# Patient Record
Sex: Female | Born: 2000 | Race: White | Hispanic: No | Marital: Single | Smoking: Never smoker
Health system: Southern US, Community
[De-identification: ages and names within clinical notes are randomized; demographics above are authoritative.]

## PROBLEM LIST (undated history)

## (undated) HISTORY — PX: WISDOM TOOTH EXTRACTION: SHX21

---

## 2019-12-15 ENCOUNTER — Encounter: Payer: Self-pay | Admitting: Gastroenterology

## 2019-12-23 ENCOUNTER — Other Ambulatory Visit: Payer: Self-pay

## 2019-12-23 ENCOUNTER — Encounter: Payer: Self-pay | Admitting: Emergency Medicine

## 2019-12-23 DIAGNOSIS — K5900 Constipation, unspecified: Secondary | ICD-10-CM | POA: Diagnosis not present

## 2019-12-23 DIAGNOSIS — R140 Abdominal distension (gaseous): Secondary | ICD-10-CM | POA: Insufficient documentation

## 2019-12-23 LAB — CBC WITH DIFFERENTIAL/PLATELET
Abs Immature Granulocytes: 0.03 10*3/uL (ref 0.00–0.07)
Basophils Absolute: 0.1 10*3/uL (ref 0.0–0.1)
Basophils Relative: 1 %
Eosinophils Absolute: 0.2 10*3/uL (ref 0.0–0.5)
Eosinophils Relative: 2 %
HCT: 40.3 % (ref 36.0–46.0)
Hemoglobin: 13.2 g/dL (ref 12.0–15.0)
Immature Granulocytes: 0 %
Lymphocytes Relative: 42 %
Lymphs Abs: 3.9 10*3/uL (ref 0.7–4.0)
MCH: 29.4 pg (ref 26.0–34.0)
MCHC: 32.8 g/dL (ref 30.0–36.0)
MCV: 89.8 fL (ref 80.0–100.0)
Monocytes Absolute: 0.7 10*3/uL (ref 0.1–1.0)
Monocytes Relative: 8 %
Neutro Abs: 4.5 10*3/uL (ref 1.7–7.7)
Neutrophils Relative %: 47 %
Platelets: 269 10*3/uL (ref 150–400)
RBC: 4.49 MIL/uL (ref 3.87–5.11)
RDW: 12.8 % (ref 11.5–15.5)
WBC: 9.3 10*3/uL (ref 4.0–10.5)
nRBC: 0 % (ref 0.0–0.2)

## 2019-12-23 LAB — URINALYSIS, COMPLETE (UACMP) WITH MICROSCOPIC
Bilirubin Urine: NEGATIVE
Glucose, UA: NEGATIVE mg/dL
Hgb urine dipstick: NEGATIVE
Ketones, ur: NEGATIVE mg/dL
Leukocytes,Ua: NEGATIVE
Nitrite: NEGATIVE
Protein, ur: NEGATIVE mg/dL
Specific Gravity, Urine: 1.026 (ref 1.005–1.030)
pH: 5 (ref 5.0–8.0)

## 2019-12-23 LAB — COMPREHENSIVE METABOLIC PANEL
ALT: 20 U/L (ref 0–44)
AST: 26 U/L (ref 15–41)
Albumin: 4.8 g/dL (ref 3.5–5.0)
Alkaline Phosphatase: 55 U/L (ref 38–126)
Anion gap: 7 (ref 5–15)
BUN: 18 mg/dL (ref 6–20)
CO2: 27 mmol/L (ref 22–32)
Calcium: 9.2 mg/dL (ref 8.9–10.3)
Chloride: 105 mmol/L (ref 98–111)
Creatinine, Ser: 0.67 mg/dL (ref 0.44–1.00)
GFR calc Af Amer: >60 mL/min (ref >60)
GFR calc non Af Amer: >60 mL/min (ref >60)
Glucose, Bld: 108 mg/dL — ABNORMAL HIGH (ref 70–99)
Potassium: 3.7 mmol/L (ref 3.5–5.1)
Sodium: 139 mmol/L (ref 135–145)
Total Bilirubin: 0.6 mg/dL (ref 0.3–1.2)
Total Protein: 7 g/dL (ref 6.5–8.1)

## 2019-12-23 LAB — POCT PREGNANCY, URINE: Preg Test, Ur: NEGATIVE

## 2019-12-23 LAB — LIPASE, BLOOD: Lipase: 29 U/L (ref 11–51)

## 2019-12-23 NOTE — ED Triage Notes (Addendum)
Patient ambulatory to triage with steady gait, without difficulty or distress noted, mask in place; pt reports generalized abd pain and bloating x month with no accomp symptoms

## 2019-12-24 ENCOUNTER — Emergency Department
Admission: EM | Admit: 2019-12-24 | Discharge: 2019-12-24 | Disposition: A | Discharge status: Home / Self Care | Payer: 59 | Attending: Emergency Medicine | Admitting: Emergency Medicine

## 2019-12-24 ENCOUNTER — Emergency Department: Payer: 59

## 2019-12-24 DIAGNOSIS — R140 Abdominal distension (gaseous): Secondary | ICD-10-CM

## 2019-12-24 DIAGNOSIS — K5900 Constipation, unspecified: Secondary | ICD-10-CM

## 2019-12-24 NOTE — Discharge Instructions (Signed)
Constipation: Take colace twice a day everyday. Take senna once a day at bedtime. Take daily probiotics. Drink plenty of fluids and eat a diet rich in fiber. For the next 3 -5 days take 3600mg  or 87ml of 400mg /71ml Milk of Magnesia at bedtime. If that does not work, take 1 cap full of miralax in the morning and one in the evening for 3-5days.  Follow-up with your GI doctor on your scheduled appointment.  Follow-up with your PCP for further monitoring.  Return to the emergency room for new or worsening abdominal pain, vomiting, fever.

## 2019-12-24 NOTE — ED Notes (Addendum)
Pt taken to Xray.

## 2019-12-24 NOTE — ED Notes (Signed)
Pt back from X-ray.  

## 2019-12-24 NOTE — ED Provider Notes (Signed)
[facility] Emergency Department Provider Note  ____________________________________________  Time seen: Approximately 12:29 AM  I have reviewed the triage vital signs and the nursing notes.   HISTORY  Chief Complaint Abdominal Pain   HPI Natalye Kott is a 19 y.o. female no significant past medical history who presents for evaluation of abdominal pain and bloating.  Patient reports that she has been extremely bloated over the last month.  Has had intermittent episodes of sharp abdominal pain.  She reports 3 episodes in total with the third 1 being today which prompted her visit to the emergency room.  The pain has now resolved.  Patient reports that she usually goes to the bathroom 4-5 times a day for bowel movement but over the last month has only been going once or twice.  Has had more gas than normal. No prior abdominal surgeries. Passing flatus.  No nausea or vomiting, no diarrhea or constipation, no vaginal discharge, no dysuria or hematuria, no chest pain or shortness of breath.  Patient denies any noticeable weight changes.  She reports that she has been keeping a food diary and is unable to pinpoint any specific foods that make her feel worse or better.  She denies any family history of IBS, IBD, celiac, GI or GU malignancy.  She is scheduled an appointment with the GI doctor but that is not until the mid of March. Menstrual periods are normal. She has an IUD in place.   PMH None - reviewed  Allergies Patient has no known allergies.  FH No family history of GI or GU malignancy  Social History Smoking - no Alcohol - occasionally Drugs - none currently  Review of Systems  Constitutional: Negative for fever. Eyes: Negative for visual changes. ENT: Negative for sore throat. Neck: No neck pain  Cardiovascular: Negative for chest pain. Respiratory: Negative for shortness of breath. Gastrointestinal: + bloating and abdominal pain. No vomiting or  diarrhea. Genitourinary: Negative for dysuria. Musculoskeletal: Negative for back pain. Skin: Negative for rash. Neurological: Negative for headaches, weakness or numbness. Psych: No SI or HI  ____________________________________________   PHYSICAL EXAM:  VITAL SIGNS: ED Triage Vitals  Enc Vitals Group     BP 12/23/19 2314 100/69     Pulse Rate 12/23/19 2314 63     Resp 12/23/19 2314 18     Temp 12/23/19 2314 98 F (36.7 C)     Temp Source 12/23/19 2314 Oral     SpO2 12/23/19 2314 100 %     Weight 12/23/19 2308 119 lb (54 kg)     Height 12/23/19 2308 5\' 3"  (1.6 m)     Head Circumference --      Peak Flow --      Pain Score 12/23/19 2308 6     Pain Loc --      Pain Edu? --      Excl. in GC? --     Constitutional: Alert and oriented. Well appearing and in no apparent distress. HEENT:      Head: Normocephalic and atraumatic.         Eyes: Conjunctivae are normal. Sclera is non-icteric.       Mouth/Throat: Mucous membranes are moist.       Neck: Supple with no signs of meningismus. Cardiovascular: Regular rate and rhythm. No murmurs, gallops, or rubs.  Respiratory: Normal respiratory effort. Lungs are clear to auscultation bilaterally.  Gastrointestinal: Soft, non tender, and non distended with positive bowel sounds. No rebound or guarding.  Genitourinary: No CVA tenderness. Musculoskeletal: No edema, cyanosis, or erythema of extremities. Neurologic: Normal speech and language. Face is symmetric. Moving all extremities. No gross focal neurologic deficits are appreciated. Skin: Skin is warm, dry and intact. No rash noted. Psychiatric: Mood and affect are normal. Speech and behavior are normal.  ____________________________________________   LABS (all labs ordered are listed, but only abnormal results are displayed)  Labs Reviewed  COMPREHENSIVE METABOLIC PANEL - Abnormal; Notable for the following components:      Result Value   Glucose, Bld 108 (*)    All other  components within normal limits  URINALYSIS, COMPLETE (UACMP) WITH MICROSCOPIC - Abnormal; Notable for the following components:   Color, Urine YELLOW (*)    APPearance HAZY (*)    Bacteria, UA MANY (*)    All other components within normal limits  CBC WITH DIFFERENTIAL/PLATELET  LIPASE, BLOOD  POCT PREGNANCY, URINE   ____________________________________________  EKG  none  ____________________________________________  RADIOLOGY  I have personally reviewed the images performed during this visit and I agree with the Radiologist's read.   Interpretation by Radiologist:  DG Abdomen 1 View  Result Date: 12/24/2019 CLINICAL DATA:  Abdominal bloating, generalized abdominal pain for 1 month EXAM: ABDOMEN - 1 VIEW COMPARISON:  None. FINDINGS: Supine frontal view of the abdomen and pelvis excludes the hemidiaphragms by collimation. Moderate stool throughout the colon. No obstruction or ileus. No masses or abnormal calcifications. IUD overlies the pelvis. No acute bony abnormalities. IMPRESSION: 1. Moderate fecal retention. 2. Otherwise unremarkable exam. Electronically Signed   By: Sharlet Salina M.D.   On: 12/24/2019 00:36      ____________________________________________   PROCEDURES  Procedure(s) performed: None Procedures Critical Care performed:  None ____________________________________________   INITIAL IMPRESSION / ASSESSMENT AND PLAN / ED COURSE   19 y.o. female no significant past medical history who presents for evaluation of abdominal bloating x 1 month and intermittent abd pain.  Patient is extremely well-appearing in no distress with normal vital signs, abdomen is soft with no palpable masses, no tenderness, positive bowel sounds, no distention.  Differential diagnosis including constipation versus IBS versus pregnancy versus IBD versus SBO.  Labs WNL. KUB showing moderate stool burden. UA negative. Upreg negative. Will dc home on aggressive bowel regimen for the  next 3-5 days to see if symptoms resolve. Recommended keeping appointment with GI on March for further eval and discussed close follow up with PCP for further management. Discussed my standard return precautions.        As part of my medical decision making, I reviewed the following data within the electronic MEDICAL RECORD NUMBER Nursing notes reviewed and incorporated, Labs reviewed , Old chart reviewed, Radiograph reviewed , Notes from prior ED visits and [place] Controlled Substance Database   Please note:  Patient was evaluated in Emergency Department today for the symptoms described in the history of present illness. Patient was evaluated in the context of the global COVID-19 pandemic, which necessitated consideration that the patient might be at risk for infection with the SARS-CoV-2 virus that causes COVID-19. Institutional protocols and algorithms that pertain to the evaluation of patients at risk for COVID-19 are in a state of rapid change based on information released by regulatory bodies including the CDC and federal and state organizations. These policies and algorithms were followed during the patient's care in the ED.  Some ED evaluations and interventions may be delayed as a result of limited staffing during the pandemic.   ____________________________________________  FINAL CLINICAL IMPRESSION(S) / ED DIAGNOSES   Final diagnoses:  Abdominal bloating  Constipation, unspecified constipation type      NEW MEDICATIONS STARTED DURING THIS VISIT:  ED Discharge Orders    None       Note:  This document was prepared using Dragon voice recognition software and may include unintentional dictation errors.    Alfred Levins, [place], MD 12/24/19 [phone]

## 2019-12-24 NOTE — ED Notes (Signed)
Pt signed a hard copy of the E-signature and it was placed for medical records pick up

## 2020-01-07 DIAGNOSIS — R140 Abdominal distension (gaseous): Secondary | ICD-10-CM | POA: Insufficient documentation

## 2020-01-07 DIAGNOSIS — K5900 Constipation, unspecified: Secondary | ICD-10-CM | POA: Insufficient documentation

## 2020-01-11 ENCOUNTER — Other Ambulatory Visit (INDEPENDENT_AMBULATORY_CARE_PROVIDER_SITE_OTHER): Payer: 59

## 2020-01-11 ENCOUNTER — Encounter: Payer: Self-pay | Admitting: Gastroenterology

## 2020-01-11 ENCOUNTER — Other Ambulatory Visit: Payer: Self-pay

## 2020-01-11 ENCOUNTER — Ambulatory Visit (INDEPENDENT_AMBULATORY_CARE_PROVIDER_SITE_OTHER): Payer: 59 | Admitting: Gastroenterology

## 2020-01-11 VITALS — BP 116/70 | HR 60 | Temp 97.5°F | Ht 63.0 in | Wt 125.0 lb

## 2020-01-11 DIAGNOSIS — R1013 Epigastric pain: Secondary | ICD-10-CM | POA: Diagnosis not present

## 2020-01-11 DIAGNOSIS — R140 Abdominal distension (gaseous): Secondary | ICD-10-CM

## 2020-01-11 LAB — IGA: IgA: 145 mg/dL (ref 68–378)

## 2020-01-11 NOTE — Progress Notes (Signed)
HPI: This is a very pleasant 19 year old woman whom I am meeting for the first time today  She has had intermittent epigastric pains and bloating and a relative change in her bowel habits over the past 2 or 3 months.  The epigastric pains were severe, lasting 40 minutes, sharp, she had some mild associated nausea but no vomiting.  No radiation of the pain.  After her first episode she began to feel bloated most of the time.  She feels like she is swollen every morning when she wakes up.  The bloating is quite bothersome.  Around the same time she noticed relative constipation starting as well.  Prior to this she would have 2-3 bowel movements a day without any pushing or straining.  When the symptoms started she started having her bowels move only once per day and she did have to push and strain a bit.  She presented to the emergency room and has been testing see that below.  She was told she might be constipated and was given Colace, Senokot, milk of magnesia for 3 to 5 days and then MiraLAX.  She did move her bowels a bit more often but the bloating never improved.  She saw her pediatrician and they recommended an enema.  She did that and it did not change at all.  She is very fit, she leads a very healthy lifestyle and has for a long time been eating very clean.  Her weight is slightly increased recently  Old Data Reviewed:  KUB February 2021 "moderate fecal retention"  Blood work February 2021 normal CBC, normal complete metabolic profile, normal lipase, negative pregnancy test, essentially normal urinalysis.    Review of systems: Pertinent positive and negative review of systems were noted in the above HPI section. All other review negative.   History reviewed. No pertinent past medical history.  Past Surgical History:  Procedure Laterality Date  . WISDOM TOOTH EXTRACTION      Current Outpatient Medications  Medication Sig Dispense Refill  . Digestive Enzymes CAPS Take  by mouth daily.    . Probiotic Product (PROBIOTIC ADVANCED PO) Take by mouth daily.     No current facility-administered medications for this visit.    Allergies as of 01/11/2020  . (No Known Allergies)    Family History  Family history unknown: Yes    Social History   Socioeconomic History  . Marital status: Single    Spouse name: Not on file  . Number of children: Not on file  . Years of education: Not on file  . Highest education level: Not on file  Occupational History  . Occupation: Ship broker   Tobacco Use  . Smoking status: Never Smoker  . Smokeless tobacco: Never Used  Substance and Sexual Activity  . Alcohol use: Yes    Comment: 3x a month  . Drug use: Not Currently  . Sexual activity: Not on file  Other Topics Concern  . Not on file  Social History Narrative  . Not on file   Social Determinants of Health   Financial Resource Strain:   . Difficulty of Paying Living Expenses:   Food Insecurity:   . Worried About Charity fundraiser in the Last Year:   . Arboriculturist in the Last Year:   Transportation Needs:   . Film/video editor (Medical):   Marland Kitchen Lack of Transportation (Non-Medical):   Physical Activity:   . Days of Exercise per Week:   . Minutes of Exercise  per Session:   Stress:   . Feeling of Stress :   Social Connections:   . Frequency of Communication with Friends and Family:   . Frequency of Social Gatherings with Friends and Family:   . Attends Religious Services:   . Active Member of Clubs or Organizations:   . Attends Banker Meetings:   Marland Kitchen Marital Status:   Intimate Partner Violence:   . Fear of Current or Ex-Partner:   . Emotionally Abused:   Marland Kitchen Physically Abused:   . Sexually Abused:      Physical Exam: BP 116/70   Pulse 60   Temp (!) 97.5 F (36.4 C)   Ht 5\' 3"  (1.6 m)   Wt 125 lb (56.7 kg)   BMI 22.14 kg/m  Constitutional: generally well-appearing Psychiatric: alert and oriented x3 Eyes: extraocular  movements intact Mouth: oral pharynx moist, no lesions Neck: supple no lymphadenopathy Cardiovascular: heart regular rate and rhythm Lungs: clear to auscultation bilaterally Abdomen: soft, nontender, nondistended, no obvious ascites, no peritoneal signs, normal bowel sounds Extremities: no lower extremity edema bilaterally Skin: no lesions on visible extremities   Assessment and plan: 19 y.o. female with intermittent epigastric pains, bloating, relative constipation  First of all the 2 episodes of severe epigastric pains certainly might be biliary.  I recommended an abdominal ultrasound to check for gallstones and exclude other potential causes.  Constipation may be causing her bloating but would unlikely be causing the epigastric pains.  Her constipation has eased off lately and so I will would not recommend any changes to her diet or fiber supplements at this point.  I would like to check her for celiac sprue using blood serologies.  I am also going to send stool for H. pylori antigen testing.  She understands if none of the above tests are helpful then I would likely arrange upper endoscopy   Please see the "Patient Instructions" section for addition details about the plan.   25, MD [facility] 01/11/2020, 2:04 PM   Total time on date of encounter was 45  minutes (this included time spent preparing to see the patient reviewing records; obtaining and/or reviewing separately obtained history; performing a medically appropriate exam and/or evaluation; counseling and educating the patient and family if present; ordering medications, tests or procedures if applicable; and documenting clinical information in the health record).

## 2020-01-11 NOTE — Patient Instructions (Signed)
Your provider has requested that you go to the basement level for lab work before leaving today. Press "B" on the elevator. The lab is located at the first door on the left as you exit the elevator.  You have been scheduled for an abdominal ultrasound at [facility] (1st floor of hospital) on 01/14/20 at 8:30 am. Please arrive 15 minutes prior to your appointment for registration. Make certain not to have anything to eat or drink 6 hours prior to your appointment. Should you need to reschedule your appointment, please contact radiology at [phone]. This test typically takes about 30 minutes to perform.  If you are age 20 or older, your body mass index should be between 23-30. Your Body mass index is 22.14 kg/m. If this is out of the aforementioned range listed, please consider follow up with your Primary Care Provider.  If you are age 18 or younger, your body mass index should be between 19-25. Your Body mass index is 22.14 kg/m. If this is out of the aformentioned range listed, please consider follow up with your Primary Care Provider.

## 2020-01-12 LAB — TISSUE TRANSGLUTAMINASE, IGA: (tTG) Ab, IgA: 1 U/mL

## 2020-01-13 ENCOUNTER — Other Ambulatory Visit: Payer: 59

## 2020-01-13 DIAGNOSIS — R1013 Epigastric pain: Secondary | ICD-10-CM

## 2020-01-13 DIAGNOSIS — R140 Abdominal distension (gaseous): Secondary | ICD-10-CM

## 2020-01-14 ENCOUNTER — Ambulatory Visit (HOSPITAL_COMMUNITY): Payer: 59

## 2020-01-14 LAB — HELICOBACTER PYLORI  SPECIAL ANTIGEN
MICRO NUMBER:: 10266189
SPECIMEN QUALITY: ADEQUATE

## 2020-01-19 ENCOUNTER — Telehealth: Payer: Self-pay | Admitting: Gastroenterology

## 2020-01-19 NOTE — Telephone Encounter (Signed)
Pt requested [place] order to be sent to [facility] as it will be more cost effective for her.

## 2020-01-20 ENCOUNTER — Other Ambulatory Visit: Payer: Self-pay

## 2020-01-20 ENCOUNTER — Ambulatory Visit: Payer: 59

## 2020-01-20 DIAGNOSIS — R1013 Epigastric pain: Secondary | ICD-10-CM

## 2020-01-20 NOTE — Telephone Encounter (Signed)
Order placed in epic for [place] at GI, per GI it will go in their work que and they will contact pt with appt.

## 2020-01-26 ENCOUNTER — Ambulatory Visit
Admission: RE | Admit: 2020-01-26 | Discharge: 2020-01-26 | Disposition: A | Discharge status: Home / Self Care | Payer: 59 | Source: Ambulatory Visit | Attending: Gastroenterology | Admitting: Gastroenterology

## 2020-01-26 DIAGNOSIS — R1013 Epigastric pain: Secondary | ICD-10-CM

## 2020-03-06 ENCOUNTER — Encounter: Payer: Self-pay | Admitting: Gastroenterology

## 2020-03-06 ENCOUNTER — Ambulatory Visit (INDEPENDENT_AMBULATORY_CARE_PROVIDER_SITE_OTHER): Payer: 59 | Admitting: Gastroenterology

## 2020-03-06 VITALS — BP 100/70 | HR 68 | Temp 98.9°F | Ht 63.0 in | Wt 122.2 lb

## 2020-03-06 DIAGNOSIS — R140 Abdominal distension (gaseous): Secondary | ICD-10-CM | POA: Diagnosis not present

## 2020-03-06 MED ORDER — SUPREP BOWEL PREP KIT 17.5-3.13-1.6 GM/177ML PO SOLN: 1.0000 | ORAL | 0 refills | Status: AC

## 2020-03-06 NOTE — Patient Instructions (Addendum)
If you are age 20 or older, your body mass index should be between 23-30. Your Body mass index is 21.66 kg/m. If this is out of the aforementioned range listed, please consider follow up with your Primary Care Provider.  If you are age 4 or younger, your body mass index should be between 19-25. Your Body mass index is 21.66 kg/m. If this is out of the aformentioned range listed, please consider follow up with your Primary Care Provider.   You have been scheduled for a colonoscopy. Please follow written instructions given to you at your visit today.  Please pick up your prep supplies at the pharmacy within the next 1-3 days. If you use inhalers (even only as needed), please bring them with you on the day of your procedure.  Due to recent changes in healthcare laws, you may see the results of your imaging and laboratory studies on MyChart before your provider has had a chance to review them.  We understand that in some cases there may be results that are confusing or concerning to you. Not all laboratory results come back in the same time frame and the provider may be waiting for multiple results in order to interpret others.  Please give [place] 48 hours in order for your provider to thoroughly review all the results before contacting the office for clarification of your results.   Thank you for entrusting me with your care and choosing [facility].  Dr Christella Hartigan

## 2020-03-06 NOTE — Progress Notes (Signed)
KUB February 2021 "moderate fecal retention"  Blood work February 2021 normal CBC, normal complete metabolic profile, normal lipase, negative pregnancy test, essentially normal urinalysis.  Blood work March 2021 H. pylori stool testing was negative, celiac sprue serologies were negative.  Ultrasound March 2021 was normal.  HPI: This is a very pleasant 19 year old woman whom I last saw about 2 months ago here in the office.  She was having some intermittent lower abdominal discomforts and also 2 episodes of significant upper abdominal pains.  Ultrasound of her gallbladder was normal.  Testing for celiac sprue and H. pylori were both negative.  She is here today in follow-up to discuss her symptoms again  She is actually more bothered by lower abdominal complaints than upper in general.  She feels very bloated.  She has had some change in her stools, the caliber is thinner over the past couple months.  She has had lower abdominal cramping but has not improved when she moves her bowels.  She feels quite bloated.  She showed me a selfie in which her lower abdomen was clearly pouched out a bit more than is usual for her.  She does intermittently have urgency with her bowels.  She has never seen blood in her stool.  Her weight is overall stable.  Interestingly 2 times over the past several weeks she has felt better about her bloating and lower abdominal discomforts and those were both times when she had drinking quite a bit of alcohol over the weekend.  In general her diet is very healthy with lots of fruits and vegetables.   ROS: complete GI ROS as described in HPI, all other review negative.  Constitutional:  No unintentional weight loss   History reviewed. No pertinent past medical history.  Past Surgical History:  Procedure Laterality Date  . WISDOM TOOTH EXTRACTION      Current Outpatient Medications  Medication Sig Dispense Refill  . KYLEENA 19.5 MG IUD by Intrauterine route once.     . methylphenidate (RITALIN) 10 MG tablet Take 10 mg by mouth 2 (two) times daily.    . methylphenidate 36 MG PO CR tablet Take 36 mg by mouth daily.    . Probiotic Product (PROBIOTIC ADVANCED PO) Take by mouth daily.     No current facility-administered medications for this visit.    Allergies as of 03/06/2020  . (No Known Allergies)    Family History  Family history unknown: Yes    Social History   Socioeconomic History  . Marital status: Single    Spouse name: Not on file  . Number of children: Not on file  . Years of education: Not on file  . Highest education level: Not on file  Occupational History  . Occupation: Consulting civil engineer   Tobacco Use  . Smoking status: Never Smoker  . Smokeless tobacco: Never Used  Substance and Sexual Activity  . Alcohol use: Yes    Comment: 3x a month  . Drug use: Not Currently  . Sexual activity: Not on file  Other Topics Concern  . Not on file  Social History Narrative  . Not on file   Social Determinants of Health   Financial Resource Strain:   . Difficulty of Paying Living Expenses:   Food Insecurity:   . Worried About Programme researcher, broadcasting/film/video in the Last Year:   . Barista in the Last Year:   Transportation Needs:   . Freight forwarder (Medical):   Marland Kitchen Lack of Transportation (  Non-Medical):   Physical Activity:   . Days of Exercise per Week:   . Minutes of Exercise per Session:   Stress:   . Feeling of Stress :   Social Connections:   . Frequency of Communication with Friends and Family:   . Frequency of Social Gatherings with Friends and Family:   . Attends Religious Services:   . Active Member of Clubs or Organizations:   . Attends Archivist Meetings:   Marland Kitchen Marital Status:   Intimate Partner Violence:   . Fear of Current or Ex-Partner:   . Emotionally Abused:   Marland Kitchen Physically Abused:   . Sexually Abused:      Physical Exam: BP 100/70 (BP Location: Left Arm, Patient Position: Sitting, Cuff Size: Normal)    Pulse 68   Temp 98.9 F (37.2 C)   Ht 5\' 3"  (1.6 m) Comment: height measured without shoes  Wt 122 lb 4 oz (55.5 kg)   LMP 02/21/2020   BMI 21.66 kg/m  Constitutional: generally well-appearing Psychiatric: alert and oriented x3 Abdomen: soft, nontender, nondistended, no obvious ascites, no peritoneal signs, normal bowel sounds No peripheral edema noted in lower extremities  Assessment and plan: 19 y.o. female with lower abdominal discomforts, change in stool caliber, bloating  Explained to her I think that nothing is serious going on.  She has obviously had quite a bit of anxiety about possible underlying cancer which I think is quite unlikely.  Given all of her lower GI symptoms I did recommend colonoscopy to make certain of that.  I think it is certainly possible that with her very healthy diet high in fruits and vegetables she has a tendency to be gas producing, bloated.  The weekends where she drank quite a bit of alcohol might have just caused her to be a little bit less anxious, more relaxed and that could be why she felt better about her lower abdominal discomforts.  She wants to get this done before she goes back home to [facility] and so we will added onto this Friday morning.  Please see the "Patient Instructions" section for addition details about the plan.  Owens Loffler, MD [facility] 03/06/2020, 2:53 PM   Total time on date of encounter was 30 minutes (this included time spent preparing to see the patient reviewing records; obtaining and/or reviewing separately obtained history; performing a medically appropriate exam and/or evaluation; counseling and educating the patient and family if present; ordering medications, tests or procedures if applicable; and documenting clinical information in the health record).

## 2020-03-07 ENCOUNTER — Encounter: Payer: Self-pay | Admitting: Gastroenterology

## 2020-03-08 ENCOUNTER — Telehealth: Payer: Self-pay | Admitting: Gastroenterology

## 2020-03-08 NOTE — Telephone Encounter (Signed)
Okay, thanks

## 2020-03-08 NOTE — Telephone Encounter (Signed)
Good morning Dr. Christella Hartigan,  Patient called to cancel procedure scheduled for 03/10/20. Said her mother does not want her to have a colonoscopy.      Thank you

## 2020-03-10 ENCOUNTER — Encounter: Payer: 59 | Admitting: Gastroenterology

## 2022-02-10 IMAGING — US US ABDOMEN COMPLETE
1 series · 14 of 25 positions shown · non-contrast
Comparison: None

CLINICAL DATA: Abdominal pain

EXAM:
ABDOMEN ULTRASOUND COMPLETE

[Series 1: us abdomen complete · 0.19mm/px · 14 of 103 slices shown]
[im 1/103]
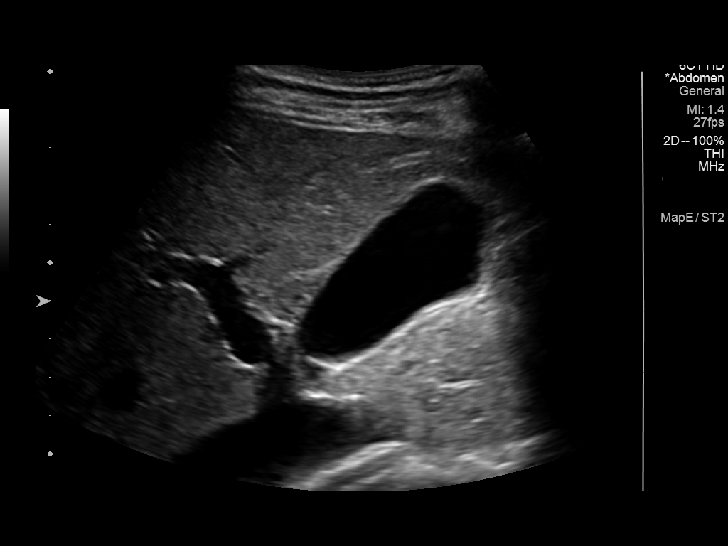
[im 9/103]
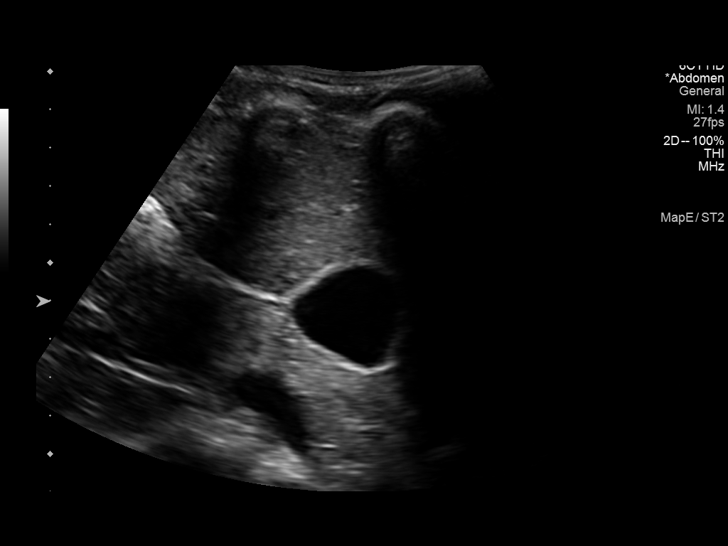
[im 18/103]
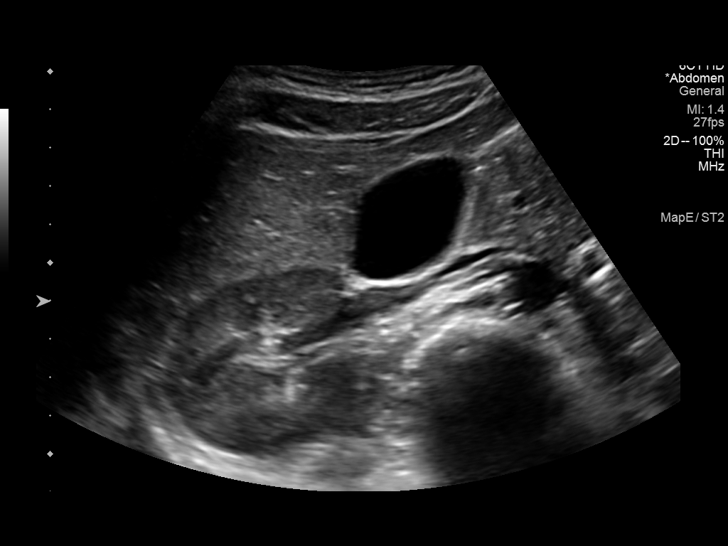
[im 26/103]
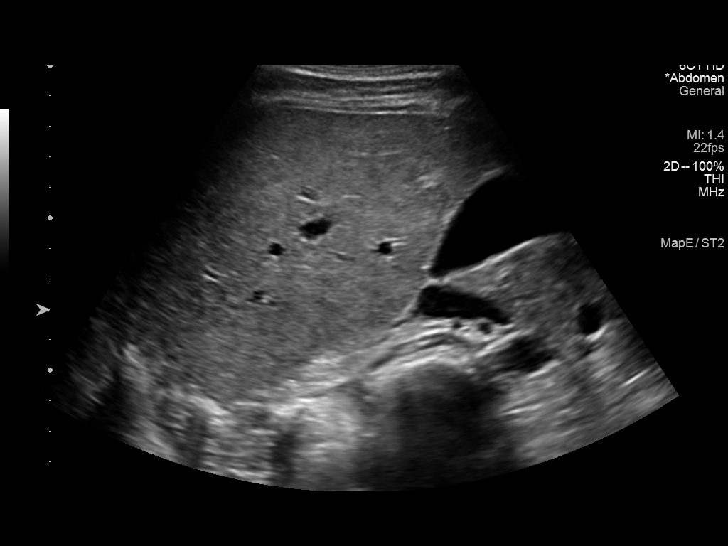
[im 35/103]
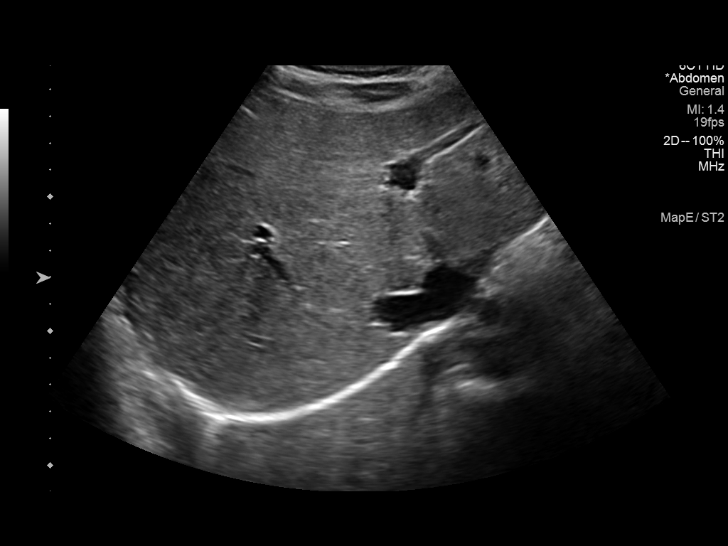
[im 39/103]
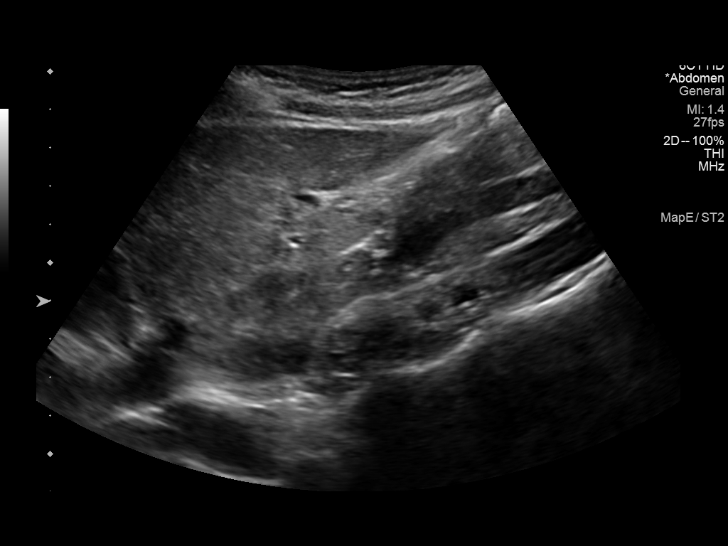
[im 47/103]
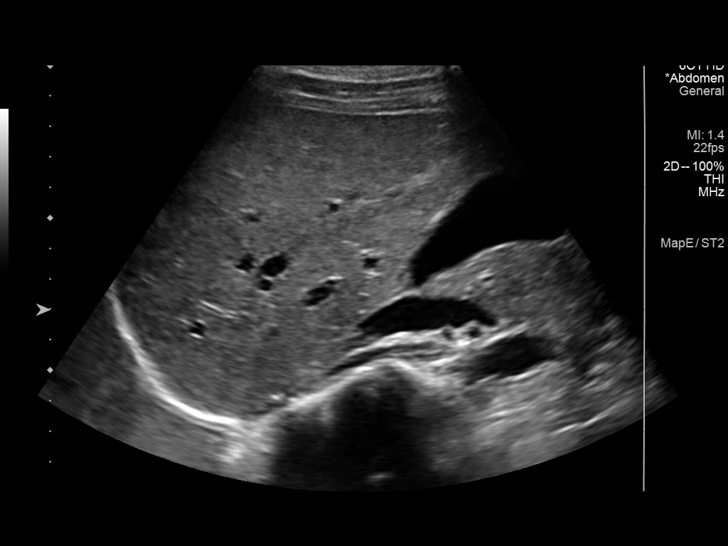
[im 56/103]
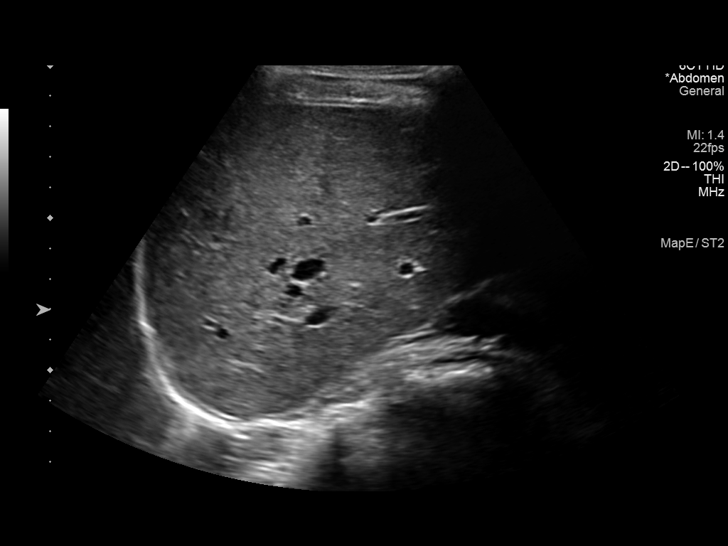
[im 64/103]
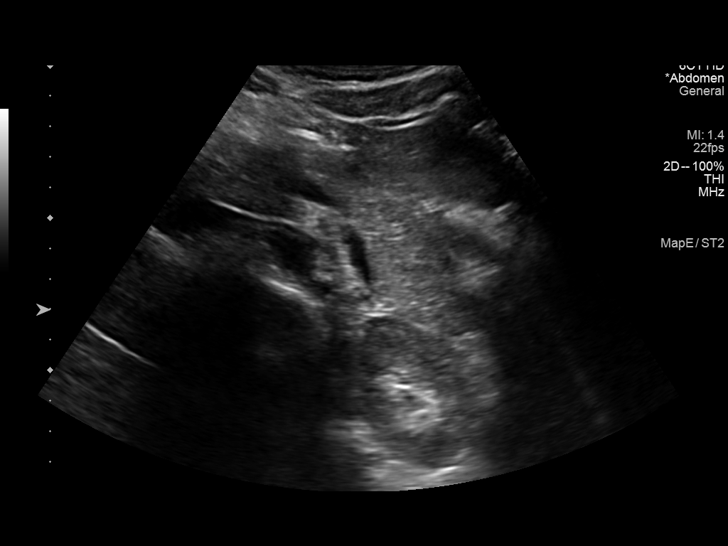
[im 69/103]
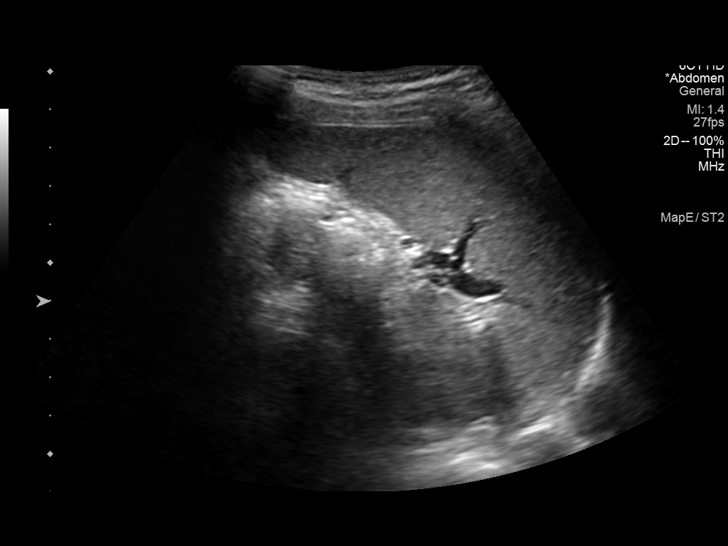
[im 77/103]
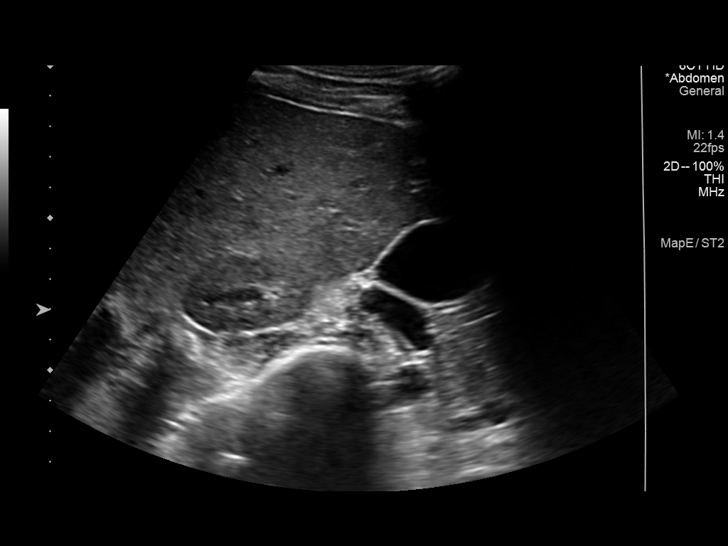
[im 86/103]
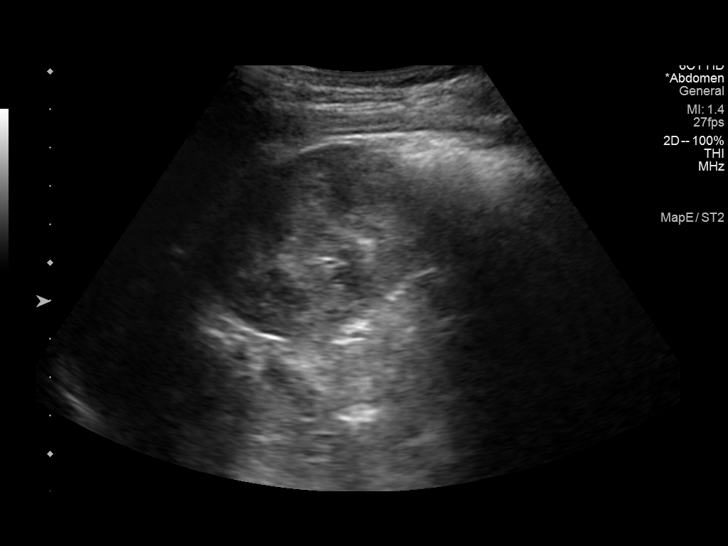
[im 94/103]
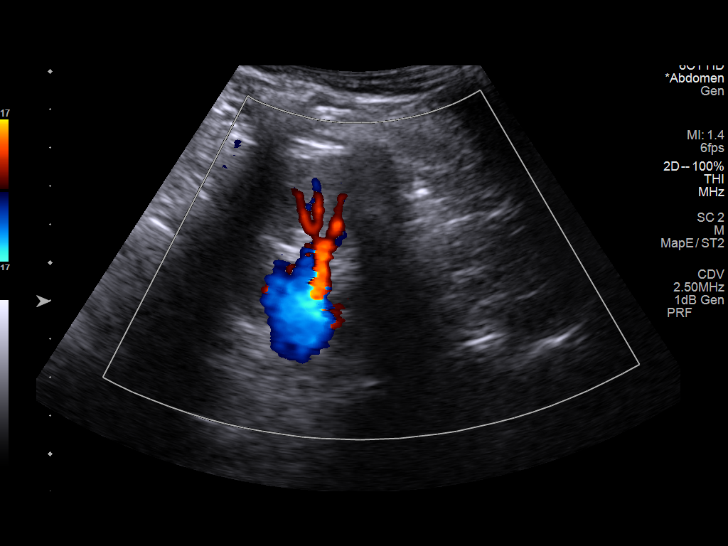
[im 103/103]
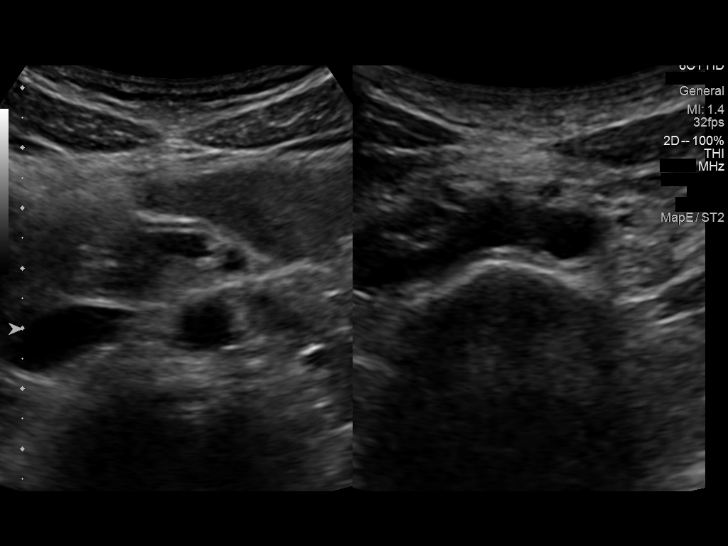

[14 of 25 positions shown; findings below may reference images not displayed]

FINDINGS: Gallbladder: No gallstones or wall thickening visualized. No
sonographic Murphy sign noted by sonographer.

Common bile duct: Diameter: 2.1 mm

Liver: No focal lesion identified. Within normal limits in
parenchymal echogenicity. Portal vein is patent on color Doppler
imaging with normal direction of blood flow towards the liver.

IVC: No abnormality visualized.

Pancreas: Visualized portion unremarkable.

Spleen: Size and appearance within normal limits.

Right Kidney: Length: 10.3 cm. Echogenicity within normal limits. No
mass or hydronephrosis visualized.

Left Kidney: Length: 8.9 cm. Echogenicity within normal limits. No
mass or hydronephrosis visualized.

Abdominal aorta: No aneurysm visualized.

Other findings: None.
IMPRESSION: 1. Normal abdominal sonogram.
